# Patient Record
Sex: Male | Born: 1980 | Race: Black or African American | Hispanic: No | Marital: Married | Smoking: Current every day smoker
Health system: Southern US, Community
[De-identification: ages and names within clinical notes are randomized; demographics above are authoritative.]

---

## 2009-09-22 ENCOUNTER — Emergency Department (HOSPITAL_COMMUNITY): Admission: EM | Admit: 2009-09-22 | Discharge: 2009-09-22 | Payer: Self-pay | Admitting: Emergency Medicine

## 2011-03-10 IMAGING — CR DG CHEST 2V
2 series · 2 of 2 positions shown · non-contrast
Comparison: None

CLINICAL DATA: Cough.  Congestion.  Headache.

CHEST - 2 VIEW

[view not recorded (1 of 2)]
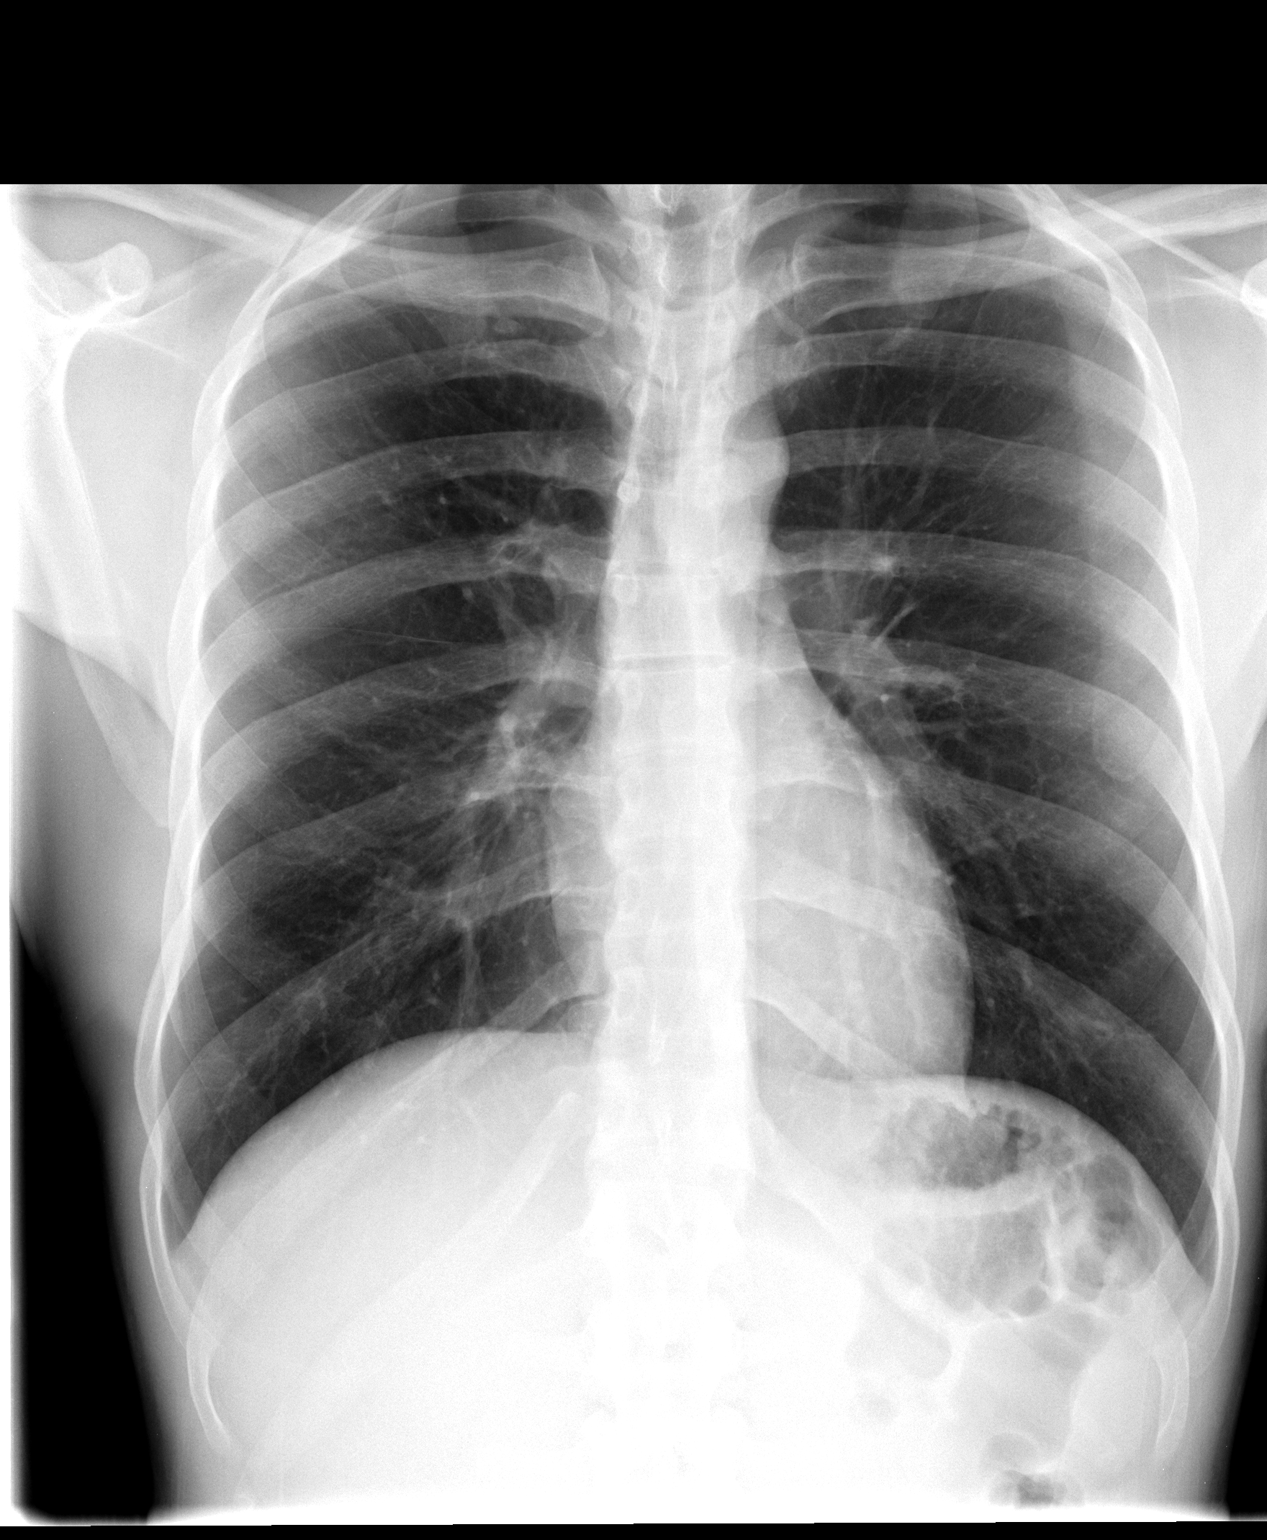

[view not recorded (2 of 2)]
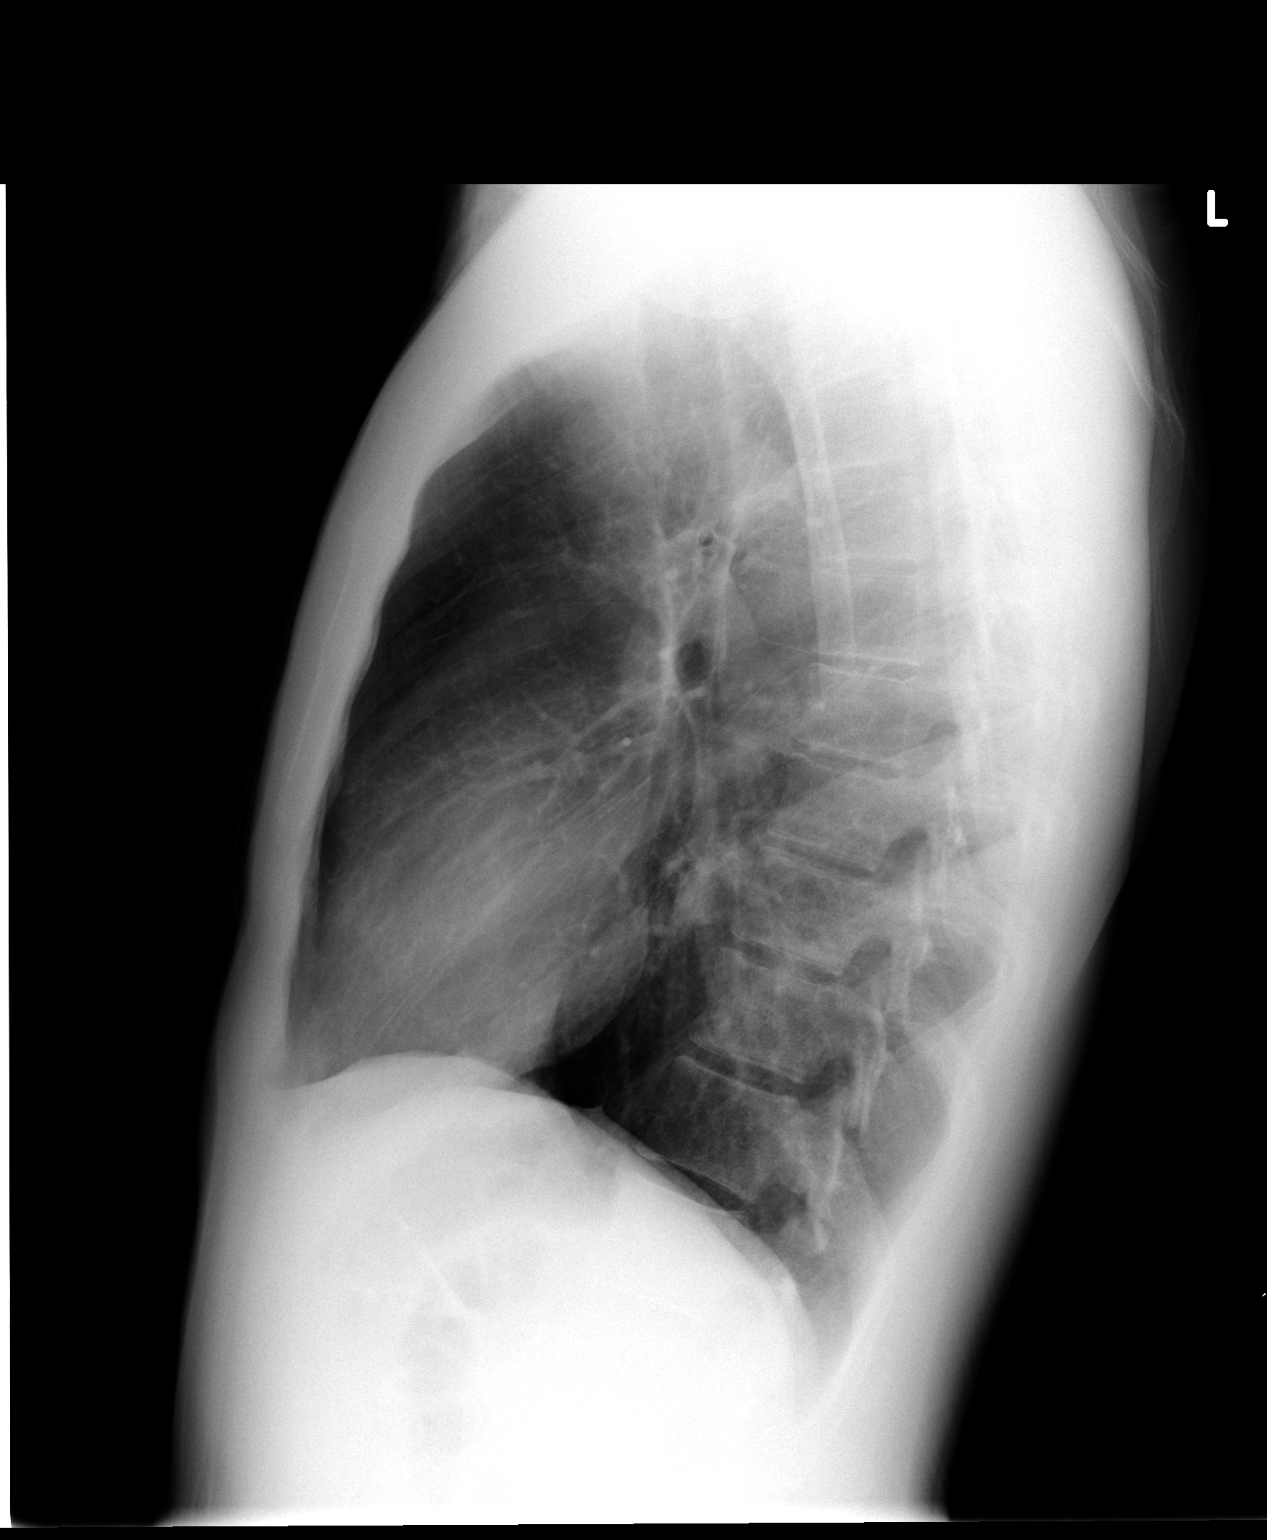

[2 of 2 positions shown; findings below may reference images not displayed]

FINDINGS: Cardiac and mediastinal contours appear normal.

The lungs appear clear.

No pleural effusion is identified.
IMPRESSION: No significant abnormality identified.

## 2014-11-01 ENCOUNTER — Telehealth: Payer: Self-pay | Admitting: Nurse Practitioner

## 2014-11-01 DIAGNOSIS — J0110 Acute frontal sinusitis, unspecified: Secondary | ICD-10-CM

## 2014-11-01 MED ORDER — AZITHROMYCIN 250 MG PO TABS: ORAL_TABLET | ORAL | Status: AC

## 2014-11-01 NOTE — Progress Notes (Signed)

## 2014-11-01 NOTE — Addendum Note (Signed)
Addended by: Bennie PieriniMARTIN, MARY-MARGARET on: 11/01/2014 06:30 PM   Modules accepted: Orders

## 2019-06-05 ENCOUNTER — Other Ambulatory Visit: Payer: Self-pay

## 2019-06-05 ENCOUNTER — Emergency Department (HOSPITAL_COMMUNITY): Payer: PRIVATE HEALTH INSURANCE

## 2019-06-05 ENCOUNTER — Emergency Department (HOSPITAL_COMMUNITY)
Admission: EM | Admit: 2019-06-05 | Discharge: 2019-06-05 | Disposition: A | Discharge status: Home / Self Care | Payer: PRIVATE HEALTH INSURANCE | Attending: Emergency Medicine | Admitting: Emergency Medicine

## 2019-06-05 ENCOUNTER — Encounter (HOSPITAL_COMMUNITY): Payer: Self-pay | Admitting: Emergency Medicine

## 2019-06-05 DIAGNOSIS — M62838 Other muscle spasm: Secondary | ICD-10-CM | POA: Diagnosis not present

## 2019-06-05 DIAGNOSIS — Y92410 Unspecified street and highway as the place of occurrence of the external cause: Secondary | ICD-10-CM | POA: Insufficient documentation

## 2019-06-05 DIAGNOSIS — Y999 Unspecified external cause status: Secondary | ICD-10-CM | POA: Insufficient documentation

## 2019-06-05 DIAGNOSIS — F17200 Nicotine dependence, unspecified, uncomplicated: Secondary | ICD-10-CM | POA: Diagnosis not present

## 2019-06-05 DIAGNOSIS — Y9389 Activity, other specified: Secondary | ICD-10-CM | POA: Diagnosis not present

## 2019-06-05 DIAGNOSIS — M25512 Pain in left shoulder: Secondary | ICD-10-CM | POA: Diagnosis present

## 2019-06-05 MED ORDER — METHOCARBAMOL 500 MG PO TABS
500.0000 mg | ORAL_TABLET | Freq: Once | ORAL | Status: AC
  Administered 2019-06-05: 500 mg via ORAL
  Filled 2019-06-05: qty 1

## 2019-06-05 MED ORDER — IBUPROFEN 800 MG PO TABS
800.0000 mg | ORAL_TABLET | Freq: Once | ORAL | Status: AC
  Administered 2019-06-05: 800 mg via ORAL
  Filled 2019-06-05: qty 1

## 2019-06-05 MED ORDER — NAPROXEN 500 MG PO TABS: 500.0000 mg | ORAL_TABLET | Freq: Two times a day (BID) | ORAL | 0 refills | Status: AC

## 2019-06-05 MED ORDER — METHOCARBAMOL 500 MG PO TABS: 500.0000 mg | ORAL_TABLET | Freq: Two times a day (BID) | ORAL | 0 refills | Status: AC
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
## 2019-06-05 NOTE — Discharge Instructions (Signed)
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
